# Patient Record
Sex: Female | Born: 1985 | Hispanic: No | Marital: Married | Smoking: Never smoker
Health system: Southern US, Community
[De-identification: ages and names within clinical notes are randomized; demographics above are authoritative.]

## PROBLEM LIST (undated history)

## (undated) DIAGNOSIS — R7303 Prediabetes: Secondary | ICD-10-CM

## (undated) DIAGNOSIS — E538 Deficiency of other specified B group vitamins: Secondary | ICD-10-CM

## (undated) DIAGNOSIS — E079 Disorder of thyroid, unspecified: Secondary | ICD-10-CM

## (undated) DIAGNOSIS — O24419 Gestational diabetes mellitus in pregnancy, unspecified control: Secondary | ICD-10-CM

## (undated) HISTORY — DX: Disorder of thyroid, unspecified: E07.9

## (undated) HISTORY — DX: Deficiency of other specified B group vitamins: E53.8

## (undated) HISTORY — PX: DILATION AND CURETTAGE OF UTERUS: SHX78

## (undated) HISTORY — DX: Prediabetes: R73.03

## (undated) HISTORY — DX: Gestational diabetes mellitus in pregnancy, unspecified control: O24.419

---

## 2013-01-26 ENCOUNTER — Encounter: Payer: Self-pay | Admitting: Adult Health

## 2013-01-26 ENCOUNTER — Ambulatory Visit (INDEPENDENT_AMBULATORY_CARE_PROVIDER_SITE_OTHER): Payer: 59 | Admitting: Adult Health

## 2013-01-26 VITALS — BP 100/62 | HR 65 | Temp 97.9°F | Resp 12 | Wt 148.0 lb

## 2013-01-26 DIAGNOSIS — J209 Acute bronchitis, unspecified: Secondary | ICD-10-CM

## 2013-01-26 MED ORDER — GUAIFENESIN-CODEINE 100-10 MG/5ML PO SYRP: 5.0000 mL | ORAL_SOLUTION | Freq: Three times a day (TID) | ORAL | Status: DC | PRN

## 2013-01-26 MED ORDER — AZITHROMYCIN 250 MG PO TABS: ORAL_TABLET | ORAL | Status: DC

## 2013-01-26 NOTE — Patient Instructions (Addendum)
  Take antibiotic as instructed.  Take Robitussin AC at bedtime for severe cough.  You can take an over-the-counter cough medicine during the day such as Delsym.  Drink plenty of liquids.

## 2013-01-26 NOTE — Assessment & Plan Note (Signed)
Symptoms ongoing x 2 months. Start Azithromycin and Robitussin AC. RTC if symptoms not improved within 3-4 days.

## 2013-01-26 NOTE — Progress Notes (Signed)
  Subjective:    Patient ID: Kristina Mueller, female    DOB: 1986-03-03, 27 y.o.   MRN: [id number]  HPI  Patient is a pleasant 27 y/o female who presents to clinic with cough that started ~ 2 months ago. Recently traveled to [place] and was there from December - March. Her symptoms began in March. She was taking "home remedies" such honey and lemon which helped somewhat; however, symptoms would return. Cough worse at night. She denies sinus congestion or post nasal drip. Cough is productive of white phlegm but has been yellow as well.   Review of Systems  Constitutional: Negative for fever and chills.  HENT: Negative for congestion, sore throat, rhinorrhea, sneezing, postnasal drip and sinus pressure.   Respiratory: Positive for cough and wheezing.   Cardiovascular: Negative for chest pain.    BP 100/62  Pulse 65  Temp(Src) 97.9 F (36.6 C) (Oral)  Resp 12  Wt 148 lb (67.132 kg)  SpO2 98%  LMP 01/20/2013    Objective:   Physical Exam  Constitutional: She is oriented to person, place, and time. She appears well-developed and well-nourished. No distress.  Cardiovascular: Normal rate, regular rhythm, normal heart sounds and intact distal pulses.  Exam reveals no gallop.   No murmur heard. Pulmonary/Chest: Effort normal and breath sounds normal. No respiratory distress. She has no wheezes. She has no rales.  Neurological: She is alert and oriented to person, place, and time.  Skin: Skin is warm and dry.  Psychiatric: She has a normal mood and affect. Her behavior is normal. Judgment and thought content normal.          Assessment & Plan:

## 2013-02-02 ENCOUNTER — Encounter: Payer: Self-pay | Admitting: Adult Health

## 2013-02-02 ENCOUNTER — Ambulatory Visit (INDEPENDENT_AMBULATORY_CARE_PROVIDER_SITE_OTHER): Payer: Managed Care, Other (non HMO) | Admitting: Adult Health

## 2013-02-02 VITALS — BP 100/62 | HR 65 | Temp 97.9°F | Resp 12 | Ht 63.25 in | Wt 147.0 lb

## 2013-02-02 DIAGNOSIS — E039 Hypothyroidism, unspecified: Secondary | ICD-10-CM

## 2013-02-02 DIAGNOSIS — N926 Irregular menstruation, unspecified: Secondary | ICD-10-CM | POA: Insufficient documentation

## 2013-02-02 DIAGNOSIS — Z0000 Encounter for general adult medical examination without abnormal findings: Secondary | ICD-10-CM

## 2013-02-02 LAB — CBC WITH DIFFERENTIAL/PLATELET
Basophils Absolute: 0.1 10*3/uL (ref 0.0–0.1)
Eosinophils Absolute: 0.4 10*3/uL (ref 0.0–0.7)
HCT: 38.3 % (ref 36.0–46.0)
Hemoglobin: 13.1 g/dL (ref 12.0–15.0)
Lymphocytes Relative: 39.1 % (ref 12.0–46.0)
Lymphs Abs: 2.3 10*3/uL (ref 0.7–4.0)
MCHC: 34.1 g/dL (ref 30.0–36.0)
Monocytes Relative: 6.7 % (ref 3.0–12.0)
Neutro Abs: 2.7 10*3/uL (ref 1.4–7.7)
Platelets: 250 10*3/uL (ref 150.0–400.0)
RDW: 13.5 % (ref 11.5–14.6)

## 2013-02-02 MED ORDER — NORETHINDRONE ACET-ETHINYL EST 1-20 MG-MCG PO TABS: ORAL_TABLET | ORAL | Status: DC

## 2013-02-02 MED ORDER — THYROID 30 MG PO TABS: 30.0000 mg | ORAL_TABLET | Freq: Every day | ORAL | Status: DC

## 2013-02-02 NOTE — Assessment & Plan Note (Signed)
Patient is interested in starting back on birth control medication to regulate cycles. She will start 1 active pill x12 weeks consecutively then no pill on week 13. Patient will begin this the Sunday after her next menstrual cycle.

## 2013-02-02 NOTE — Assessment & Plan Note (Signed)
Normal physical exam including breast exam. Labs: CBC, lipids, metabolic panel. Encouraged monthly self breast exam. Discussed importance of exercise consisting of 20-30 minutes of aerobic activity 3-4 times a week. Also discussed diet low in unhealthy fats, rich in fruits, vegetables and complex carbohydrates.

## 2013-02-02 NOTE — Patient Instructions (Addendum)
   Thank you for choosing [facility] for your health care needs.  Please have your labs drawn prior to leaving the office.  The results will be available through MyChart for your convenience. Please remember to activate this. The activation code is located at the end of this form.  I am prescribing Junel 1/20 birth control pills. Start the pills the Sunday after your next menstrual cycle. You will take an active pill for 12 weeks consecutively and then no pill on week 13. This is the week you will get a period. Then start the same cycle the Sunday after your period.

## 2013-02-02 NOTE — Assessment & Plan Note (Signed)
Recent blood work done for thyroid panel. Normal. Continue Armour and refill sent to her pharmacy

## 2013-02-02 NOTE — Progress Notes (Signed)
Subjective:    Patient ID: Kristina Mueller, female    DOB: 1986-01-10, 27 y.o.   MRN: [id number]  HPI  Patient is a pleasant 27 year old female who presents to clinic for her yearly physical. She reports feeling well overall. She has problems with her menstrual cycle. Describes menstrual cycle coming approximately every 3-4 months. She has been on birth control in the past to regulate this. Patient is not currently taking any birth control medication.   Past Medical History  Diagnosis Date  . Thyroid disease     hypothyroid    History   Social History  . Marital Status: Married    Spouse Name: N/A    Number of Children: N/A  . Years of Education: N/A   Occupational History  . Not on file.   Social History Main Topics  . Smoking status: Never Smoker   . Smokeless tobacco: Never Used  . Alcohol Use: No  . Drug Use: No  . Sexually Active: Yes    Birth Control/ Protection: Condom   Other Topics Concern  . Not on file   Social History Narrative  . No narrative on file    Current Outpatient Prescriptions on File Prior to Visit  Medication Sig Dispense Refill  . Multiple Vitamins-Calcium (ONE-A-DAY WOMENS PO) Take by mouth.       No current facility-administered medications on file prior to visit.     Health Maintenance:  Tdap - Uncertain. Need to obtain pt's medication records.  Flu shot - Did not get  PAP - July 2013  Mammography - n/a. Encouraged monthly self breast exam  Colonoscopy - n/a  Labs - cbc, metabolic panel and cholesterol  Depression Screen - no sadness, hopelessness or anhedonia  Tobacco Use - Never  Dental Exams - 12/2012  Vision Exam - Not had done  Exercise - Treadmill 3 days/week for 30 minutes  Diet - occasionally skips meals. Eats lean chicken, goat. No beef, [place] or pork. Does not like fish. Eats fruits and vegetables.   Review of Systems  Constitutional: Negative for fever, chills, appetite change and fatigue.  HENT:  Negative.   Eyes: Negative.   Respiratory: Negative.   Cardiovascular: Negative.   Gastrointestinal: Negative.   Endocrine: Negative.   Genitourinary: Positive for menstrual problem.  Musculoskeletal: Negative.   Allergic/Immunologic: Negative.   Neurological: Negative.   Hematological: Negative.   Psychiatric/Behavioral: Negative.    BP 100/62  Pulse 65  Temp(Src) 97.9 F (36.6 C) (Oral)  Resp 12  Ht 5' 3.25" (1.607 m)  Wt 147 lb (66.679 kg)  BMI 25.82 kg/m2  SpO2 98%  LMP 01/20/2013    Objective:   Physical Exam  Constitutional: She is oriented to person, place, and time. She appears well-developed and well-nourished. No distress.  HENT:  Head: Normocephalic and atraumatic.  Right Ear: External ear normal.  Left Ear: External ear normal.  Nose: Nose normal.  Mouth/Throat: Oropharynx is clear and moist. No oropharyngeal exudate.  Eyes: Conjunctivae and EOM are normal. Pupils are equal, round, and reactive to light. Right eye exhibits no discharge. Left eye exhibits no discharge. No scleral icterus.  Neck: Normal range of motion. Neck supple. No tracheal deviation present. No thyromegaly present.  Cardiovascular: Normal rate, regular rhythm and intact distal pulses.  Exam reveals friction rub. Exam reveals no gallop.   No murmur heard. Pulmonary/Chest: Effort normal and breath sounds normal. No respiratory distress. She has no wheezes. She has no rales. She exhibits no tenderness.  Abdominal:  Soft. Bowel sounds are normal. She exhibits no distension and no mass. There is no tenderness. There is no rebound and no guarding.  Genitourinary: No breast swelling, tenderness, discharge or bleeding.  Musculoskeletal: Normal range of motion. She exhibits no edema and no tenderness.  Lymphadenopathy:    She has no cervical adenopathy.  Neurological: She is alert and oriented to person, place, and time. She has normal reflexes.  Skin: Skin is warm and dry. No rash noted. She is not  diaphoretic. No erythema. No pallor.  Psychiatric: She has a normal mood and affect. Her behavior is normal. Judgment and thought content normal.       Assessment & Plan:

## 2013-02-03 LAB — COMPREHENSIVE METABOLIC PANEL
ALT: 19 U/L (ref 0–35)
AST: 19 U/L (ref 0–37)
Alkaline Phosphatase: 98 U/L (ref 39–117)
Creatinine, Ser: 0.7 mg/dL (ref 0.4–1.2)
Sodium: 139 mEq/L (ref 135–145)
Total Bilirubin: 0.8 mg/dL (ref 0.3–1.2)
Total Protein: 6.9 g/dL (ref 6.0–8.3)

## 2013-02-03 LAB — LIPID PANEL
HDL: 33.6 mg/dL — ABNORMAL LOW (ref >39.00)
LDL Cholesterol: 108 mg/dL — ABNORMAL HIGH (ref 0–99)
Total CHOL/HDL Ratio: 5
VLDL: 18 mg/dL (ref 0.0–40.0)

## 2013-02-03 LAB — LDL CHOLESTEROL, DIRECT: Direct LDL: 107.6 mg/dL

## 2013-02-25 ENCOUNTER — Ambulatory Visit: Payer: Self-pay | Admitting: Adult Health

## 2013-03-09 ENCOUNTER — Encounter: Payer: Self-pay | Admitting: Adult Health

## 2013-03-09 ENCOUNTER — Ambulatory Visit (INDEPENDENT_AMBULATORY_CARE_PROVIDER_SITE_OTHER): Payer: Managed Care, Other (non HMO) | Admitting: Adult Health

## 2013-03-09 VITALS — BP 100/62 | HR 74 | Resp 12 | Wt 147.5 lb

## 2013-03-09 DIAGNOSIS — N926 Irregular menstruation, unspecified: Secondary | ICD-10-CM

## 2013-03-09 DIAGNOSIS — N912 Amenorrhea, unspecified: Secondary | ICD-10-CM

## 2013-03-09 LAB — POCT URINE PREGNANCY: Preg Test, Ur: NEGATIVE

## 2013-03-09 NOTE — Assessment & Plan Note (Signed)
Patient has had a history of irregular menstrual cycles since menarche. Urine pregnancy test in office was negative. Instructed patient to start birth control pills this Sunday. She will take an active pill for 12 weeks consecutively than that no pill on week 13. She will then repeat cycle of beginning birth control on this Sunday beginning week 14. Discussed side effects of birth control. Instructed to use backup birth control method during first pack of birth control pills. RTC if any problems or concerns.

## 2013-03-09 NOTE — Progress Notes (Signed)
  Subjective:    Patient ID: Kristina Mueller, female    DOB: Mar 07, 1986, 27 y.o.   MRN: [id number]  HPI  Pt is a pleasant 27 year old female who presents to clinic with complaints of irregular menstrual cycles. She reports last menstrual cycle May 2 and lasted five days. She has not had a menstrual cycle since and would like to start on BC pills which I prescribed for her the last time I saw her on 02/02/13. She reports irregular cycles since her teenage years where she would have a period ~ every 3 months. Menarche at age 48. She had a baby girl in 2012. She reports no problems getting pregnant. She is currently using condoms to prevent pregnancy. She did a home pregnancy test this morning which was negative.  Current Outpatient Prescriptions on File Prior to Visit  Medication Sig Dispense Refill  . Multiple Vitamins-Calcium (ONE-A-DAY WOMENS PO) Take by mouth.      . thyroid (ARMOUR) 30 MG tablet Take 1 tablet (30 mg total) by mouth daily.  30 tablet  12  . norethindrone-ethinyl estradiol (MICROGESTIN,JUNEL,LOESTRIN) 1-20 MG-MCG tablet Take 1 active pill daily for 12 weeks consecutively then take no pill on week 13. Repeat cycle.  4 Package  5   No current facility-administered medications on file prior to visit.     Review of Systems  Endocrine: Negative.   Genitourinary: Positive for menstrual problem. Negative for vaginal bleeding, vaginal discharge, vaginal pain and pelvic pain.  Psychiatric/Behavioral: Negative.        Objective:   Physical Exam  Constitutional: She is oriented to person, place, and time. She appears well-developed and well-nourished. No distress.  Cardiovascular: Normal rate and regular rhythm.   Pulmonary/Chest: Effort normal. No respiratory distress.  Neurological: She is alert and oriented to person, place, and time.  Psychiatric: She has a normal mood and affect. Her behavior is normal. Judgment and thought content normal.          Assessment & Plan:

## 2013-03-09 NOTE — Patient Instructions (Addendum)
  Please provide [place] with a urine sample for a pregnancy test.  Start your birth control pills on Sunday whether you have started your period or not.  Continue to use a back up method of birth control for your first pack.  You will take one active pill daily for 12 consecutive weeks, then none on week 13. This is the week you will have a period. Then begin the entire cycle again on the following Sunday.

## 2013-03-11 ENCOUNTER — Telehealth: Payer: Self-pay | Admitting: Adult Health

## 2013-03-11 NOTE — Telephone Encounter (Signed)
Pt called she would like to start birth controls  quasense [facility] [place]

## 2013-03-11 NOTE — Telephone Encounter (Signed)
Please find out why the patient specifically wants to use this medication. I prescribed what I thought was a good option.

## 2013-03-14 ENCOUNTER — Other Ambulatory Visit: Payer: Self-pay | Admitting: Adult Health

## 2013-03-14 MED ORDER — LEVONORGEST-ETH ESTRAD 91-DAY 0.15-0.03 MG PO TABS: 1.0000 | ORAL_TABLET | Freq: Every day | ORAL | Status: DC

## 2013-03-14 NOTE — Telephone Encounter (Signed)
When we were discussing the birth control options, she should have told me she wanted to take that one. I have sent in to the pharmacy. Please let her know it will be taken exactly as we discussed (12 weeks of active pill then week # 13 no pill).

## 2013-03-14 NOTE — Telephone Encounter (Signed)
Pt states she has used this birth control before with no side effects or problems and wanted to use it again this time.

## 2013-03-14 NOTE — Telephone Encounter (Signed)
Left message for pt, advising that Rx was sent to pharmacy.

## 2013-03-17 ENCOUNTER — Encounter: Payer: Self-pay | Admitting: Adult Health

## 2013-04-01 ENCOUNTER — Telehealth: Payer: Self-pay | Admitting: Adult Health

## 2013-04-01 NOTE — Telephone Encounter (Signed)
Pt requesting one touch machine and strips so that she can occasionally check her sugars. History of gestational diabetes. Ok?

## 2013-04-01 NOTE — Telephone Encounter (Signed)
One touch Ultra mini test strips and machine called into [facility]

## 2013-04-01 NOTE — Telephone Encounter (Signed)
Blood glucose normal. This is something we monitor yearly for her. She does not need to check finger sticks.

## 2013-04-04 NOTE — Telephone Encounter (Signed)
Pt.notified

## 2013-04-26 ENCOUNTER — Encounter: Payer: Self-pay | Admitting: Adult Health

## 2013-04-26 ENCOUNTER — Encounter: Payer: Self-pay | Admitting: *Deleted

## 2013-04-26 ENCOUNTER — Ambulatory Visit (INDEPENDENT_AMBULATORY_CARE_PROVIDER_SITE_OTHER): Payer: Managed Care, Other (non HMO) | Admitting: Adult Health

## 2013-04-26 VITALS — BP 100/60 | HR 84 | Temp 97.8°F | Resp 12 | Wt 147.5 lb

## 2013-04-26 DIAGNOSIS — L80 Vitiligo: Secondary | ICD-10-CM | POA: Insufficient documentation

## 2013-04-26 DIAGNOSIS — Z713 Dietary counseling and surveillance: Secondary | ICD-10-CM

## 2013-04-26 DIAGNOSIS — R7301 Impaired fasting glucose: Secondary | ICD-10-CM | POA: Insufficient documentation

## 2013-04-26 LAB — HEMOGLOBIN A1C: Hgb A1c MFr Bld: 5.7 % (ref 4.6–6.5)

## 2013-04-26 NOTE — Assessment & Plan Note (Signed)
Patient to keep a food diary x3 days. She needs to document portion sizes. I have advised her to document everything that she eats and drinks. She will return to clinic in one to 2 weeks for evaluation. Also, encouraged her to do cardio for 30 minutes and incorporate at least 15 minutes of light weight training and abdominal exercises.

## 2013-04-26 NOTE — Assessment & Plan Note (Signed)
Patient is extremely concerned about becoming a diabetic. She has a history of gestational diabetes and I can certainly appreciate her concern for type 2 diabetes. She also has a strong family history of diabetes. Patient is reporting that her fasting blood sugars are in an abnormal range. Her diet is high in starches, carbohydrates with small amount of protein. I will check a hemoglobin A1c and hopefully this will allay some of her anxiety.

## 2013-04-26 NOTE — Assessment & Plan Note (Signed)
Very small area of discoloration. Uncertain if this is the vitiligo. However, given family history of same, I will refer her to dermatology. Refer to [facility] for evaluation.

## 2013-04-26 NOTE — Progress Notes (Signed)
  Subjective:    Patient ID: Kristina Mueller, female    DOB: [birth year], 26 y.o.   MRN: [id number]  HPI  Patient is a pleasant 27 year old female who presents to clinic with a small white patch underneath the left side of her lip. She reports her father has a history of vitiligo and she is concerned this may be the same thing.  Also, patient is concerned that she may be a diabetic. She has been checking her blood sugar at home with a glucose monitor that was given to her during pregnancy. She developed gestational diabetes. She reports that fasting blood glucose in the morning are running around 105. Her mother is a diabetic and has many problems as a result of her diabetes. Patient is extremely concerned and does not want the same for her.  Patient also reports that she is not able to lose weight. Her diet consists of rice, vegetables and chicken approximately twice a week. She does cardio exercise for 45 minutes x5 days a week. She has been consistently at 147 pounds. She just does not understand why she cannot lose weight. She is thinking that this may be a sign of diabetes.   Current Outpatient Prescriptions on File Prior to Visit  Medication Sig Dispense Refill  . levonorgestrel-ethinyl estradiol (SEASONALE,INTROVALE,JOLESSA) 0.15-0.03 MG tablet Take 1 tablet by mouth daily.  1 Package  4  . Multiple Vitamins-Calcium (ONE-A-DAY WOMENS PO) Take by mouth.      . thyroid (ARMOUR) 30 MG tablet Take 1 tablet (30 mg total) by mouth daily.  30 tablet  12   No current facility-administered medications on file prior to visit.   Review of Systems  Constitutional: Negative.   HENT: Negative.   Respiratory: Negative.   Cardiovascular: Negative.   Gastrointestinal: Negative.   Endocrine: Negative.   Genitourinary: Negative.   Neurological: Negative.   Psychiatric/Behavioral: Negative.   All other systems reviewed and are negative.       Objective:   Physical Exam  Constitutional: She is  oriented to person, place, and time. She appears well-developed and well-nourished. No distress.  HENT:  Head: Normocephalic and atraumatic.  Eyes: Conjunctivae and EOM are normal. Pupils are equal, round, and reactive to light.  Neck: Normal range of motion. Neck supple.  Cardiovascular: Normal rate, regular rhythm, normal heart sounds and intact distal pulses.  Exam reveals no gallop and no friction rub.   No murmur heard. Pulmonary/Chest: Effort normal and breath sounds normal. No respiratory distress. She has no wheezes. She has no rales. She exhibits no tenderness.  Abdominal: Soft. Bowel sounds are normal. She exhibits no distension and no mass. There is no tenderness. There is no rebound and no guarding.  Musculoskeletal: Normal range of motion. She exhibits no edema and no tenderness.  Lymphadenopathy:    She has no cervical adenopathy.  Neurological: She is alert and oriented to person, place, and time. She has normal reflexes.  Skin: Skin is warm and dry.  Small white discoloration of skin on the lateral left side of lip.  Psychiatric: She has a normal mood and affect. Her behavior is normal. Judgment and thought content normal.          Assessment & Plan:

## 2013-04-26 NOTE — Patient Instructions (Addendum)
  Keep a food diary for 3 days. Write down everything that you eat and drink in that time period.  Please include the portion size of what you are eating.  Please have your lab work done prior to leaving the office.  We will contact you with the results once they're available.  I am referring you to [facility] for evaluation of the discoloration on your face.

## 2013-04-27 ENCOUNTER — Encounter: Payer: Self-pay | Admitting: Emergency Medicine

## 2013-05-31 ENCOUNTER — Telehealth: Payer: Self-pay | Admitting: Adult Health

## 2013-05-31 NOTE — Telephone Encounter (Signed)
Pt has question about her Hemoglobin/ A1C Test results. Please contact her at [phone].Pt would like to speak with Raquel

## 2013-05-31 NOTE — Telephone Encounter (Signed)
Appointment scheduled.

## 2013-05-31 NOTE — Telephone Encounter (Signed)
Have patient make appt

## 2013-05-31 NOTE — Telephone Encounter (Signed)
Pt has researched A1C and is concerned about 5.7 being prediabetic and is concerned because she exercises and watches her diet and wants to discuss Metformin. Advised what Raquel had stated with last labwork but pt wanted more information.

## 2013-06-06 ENCOUNTER — Ambulatory Visit (INDEPENDENT_AMBULATORY_CARE_PROVIDER_SITE_OTHER): Payer: Managed Care, Other (non HMO) | Admitting: Adult Health

## 2013-06-06 ENCOUNTER — Encounter: Payer: Self-pay | Admitting: Adult Health

## 2013-06-06 VITALS — BP 102/60 | HR 64 | Resp 12 | Ht 63.25 in | Wt 148.0 lb

## 2013-06-06 DIAGNOSIS — Z3009 Encounter for other general counseling and advice on contraception: Secondary | ICD-10-CM

## 2013-06-06 DIAGNOSIS — R7309 Other abnormal glucose: Secondary | ICD-10-CM

## 2013-06-06 DIAGNOSIS — R7303 Prediabetes: Secondary | ICD-10-CM

## 2013-06-06 DIAGNOSIS — Z30011 Encounter for initial prescription of contraceptive pills: Secondary | ICD-10-CM | POA: Insufficient documentation

## 2013-06-06 MED ORDER — LEVONORGEST-ETH ESTRAD 91-DAY 0.15-0.03 MG PO TABS: 1.0000 | ORAL_TABLET | Freq: Every day | ORAL | Status: DC

## 2013-06-06 MED ORDER — METFORMIN HCL 500 MG PO TABS: ORAL_TABLET | ORAL | Status: DC

## 2013-06-06 NOTE — Assessment & Plan Note (Signed)
Hemoglobin A1c 5.7 - prediabetes. Risk factors including family history and she was also a gestational diabetic. Start metformin 500 mg with her evening meal. We will try this for one week and if tolerates this we will add 500 mg to breakfast as well. Return in 3 months for repeat A1c

## 2013-06-06 NOTE — Assessment & Plan Note (Signed)
Start quasense 1 active pill daily x12 weeks. No pills on week #13.

## 2013-06-06 NOTE — Patient Instructions (Addendum)
  Please start metformin 500 mg with your evening meal for 1 week. If you are tolerating well then we will add to your breakfast meal.  I have sent in a prescription for [employer].

## 2013-06-06 NOTE — Progress Notes (Signed)
  Subjective:    Patient ID: Kristina Mueller, female    DOB: 1985-10-04, 27 y.o.   MRN: [id number]  HPI  Patient is a pleasant 27 year old female who presents to clinic with concerns of her hemoglobin A1c which was 5.7, prediabetic. She has a family history of diabetes as well as she had gestational diabetes. She exercises regularly approximately 45 minutes daily. She watches her diet closely as well. She is very concerned and would like to start medication to prevent diabetes.  She is also requesting birth control medication. We have discussed this in the past and I had sent in prescription to her pharmacy however she did not fill the medication.   Current Outpatient Prescriptions on File Prior to Visit  Medication Sig Dispense Refill  . thyroid (ARMOUR) 30 MG tablet Take 1 tablet (30 mg total) by mouth daily.  30 tablet  12   No current facility-administered medications on file prior to visit.     Review of Systems  Constitutional: Negative.   Respiratory: Negative.   Cardiovascular: Negative.   Gastrointestinal: Negative.   Endocrine: Negative.   Genitourinary: Negative.   Neurological: Negative.   Psychiatric/Behavioral: Negative.     BP 102/60  Pulse 64  Resp 12  Ht 5' 3.25" (1.607 m)  Wt 148 lb (67.132 kg)  BMI 26 kg/m2  SpO2 99%     Objective:   Physical Exam  Constitutional: She is oriented to person, place, and time. She appears well-developed and well-nourished. No distress.  Cardiovascular: Normal rate and regular rhythm.   Pulmonary/Chest: Effort normal. No respiratory distress.  Musculoskeletal: Normal range of motion.  Neurological: She is alert and oriented to person, place, and time.  Skin: Skin is warm.  Psychiatric: She has a normal mood and affect. Her behavior is normal. Thought content normal.          Assessment & Plan:

## 2013-06-13 ENCOUNTER — Telehealth: Payer: Self-pay | Admitting: *Deleted

## 2013-06-13 NOTE — Telephone Encounter (Signed)
Have her take 1000 mg with breakfast and 500 mg at dinner.

## 2013-06-13 NOTE — Telephone Encounter (Signed)
Pt left voicemail, has already increased her Metformin to 1000 mg daily, states she is not losing any weight even with exercise for one hour 5 days weekly and watching her carb intake, (has been on Metformin for only one week). Asking if she can increase Metformin to 1500 mg daily?

## 2013-06-13 NOTE — Telephone Encounter (Signed)
Pt notified. Would like a referral to dietician. Do you want her Rx for Metformin to be 500 mg tid, or divided bid? I can send Rx.

## 2013-06-13 NOTE — Telephone Encounter (Signed)
Metformin is not used to decrease weight. In some people it has weight loss as a side effect. She may increase the dose but I put her on metformin for prediabetes. I can refer her to a dietician for her weight.

## 2013-06-14 MED ORDER — METFORMIN HCL 500 MG PO TABS: ORAL_TABLET | ORAL | Status: DC

## 2013-06-30 ENCOUNTER — Ambulatory Visit: Payer: Managed Care, Other (non HMO) | Admitting: Adult Health

## 2013-07-20 ENCOUNTER — Telehealth: Payer: Self-pay | Admitting: Adult Health

## 2013-07-20 NOTE — Telephone Encounter (Signed)
The patient is taking her metformin 3 times a day as instructed , but she is running out of her medicine and she is needing a refill. She only has two day left.

## 2013-07-20 NOTE — Telephone Encounter (Signed)
Advised pt that refills are available at her pharmacy.

## 2013-08-10 ENCOUNTER — Ambulatory Visit: Payer: Managed Care, Other (non HMO) | Admitting: Adult Health

## 2013-08-12 ENCOUNTER — Telehealth: Payer: Self-pay | Admitting: Adult Health

## 2013-08-12 NOTE — Telephone Encounter (Signed)
Pt states she needs examination regarding green card and it is an emergency that she have this done today if possible.  Pt refused appt on Monday, states it has to be done today.  Please advise.

## 2013-08-12 NOTE — Telephone Encounter (Signed)
Pt states there is a form she and her husband need to have completed for her green card application. She was asking if Raquel is able to complete it for her. She says some doctors will not complete and she wanted to know before she brought for by office.

## 2013-08-12 NOTE — Telephone Encounter (Signed)
I need more information - what form specifically?

## 2013-08-15 NOTE — Telephone Encounter (Signed)
Do you mind calling her? Thank you

## 2013-08-15 NOTE — Telephone Encounter (Signed)
Left message to return call 

## 2013-08-22 NOTE — Telephone Encounter (Signed)
Left message to return to call

## 2013-11-14 LAB — CBC WITH DIFFERENTIAL/PLATELET
BASOS ABS: 0.1 10*3/uL (ref 0.0–0.1)
Basophil %: 0.9 %
Eosinophil #: 0.6 10*3/uL (ref 0.0–0.7)
Eosinophil %: 5.3 %
HCT: 36.6 % (ref 35.0–47.0)
HGB: 11.6 g/dL — ABNORMAL LOW (ref 12.0–16.0)
Lymphocyte #: 5.9 10*3/uL — ABNORMAL HIGH (ref 1.0–3.6)
Lymphocyte %: 50 %
MCH: 27.8 pg (ref 26.0–34.0)
MCHC: 31.7 g/dL — AB (ref 32.0–36.0)
MCV: 88 fL (ref 80–100)
MONO ABS: 1.1 x10 3/mm — AB (ref 0.2–0.9)
Monocyte %: 9 %
Neutrophil #: 4.1 10*3/uL (ref 1.4–6.5)
Neutrophil %: 34.8 %
Platelet: 250 10*3/uL (ref 150–440)
RBC: 4.17 10*6/uL (ref 3.80–5.20)
RDW: 13.1 % (ref 11.5–14.5)
WBC: 11.9 10*3/uL — ABNORMAL HIGH (ref 3.6–11.0)

## 2013-11-14 LAB — URINALYSIS, COMPLETE
BILIRUBIN, UR: NEGATIVE
BLOOD: NEGATIVE
GLUCOSE, UR: NEGATIVE mg/dL (ref 0–75)
Ketone: NEGATIVE
Leukocyte Esterase: NEGATIVE
NITRITE: NEGATIVE
Ph: 7 (ref 4.5–8.0)
Protein: 100
Specific Gravity: 1.018 (ref 1.003–1.030)

## 2013-11-14 LAB — COMPREHENSIVE METABOLIC PANEL
ALK PHOS: 85 U/L
ANION GAP: 8 (ref 7–16)
Albumin: 3.7 g/dL (ref 3.4–5.0)
BILIRUBIN TOTAL: 0.3 mg/dL (ref 0.2–1.0)
BUN: 7 mg/dL (ref 7–18)
CALCIUM: 8.8 mg/dL (ref 8.5–10.1)
Chloride: 108 mmol/L — ABNORMAL HIGH (ref 98–107)
Co2: 21 mmol/L (ref 21–32)
Creatinine: 0.75 mg/dL (ref 0.60–1.30)
Glucose: 83 mg/dL (ref 65–99)
Osmolality: 271 (ref 275–301)
POTASSIUM: 3.9 mmol/L (ref 3.5–5.1)
SGOT(AST): 21 U/L (ref 15–37)
SGPT (ALT): 15 U/L (ref 12–78)
Sodium: 137 mmol/L (ref 136–145)
Total Protein: 7.3 g/dL (ref 6.4–8.2)

## 2013-11-14 LAB — PROTIME-INR
INR: 1.1
PROTHROMBIN TIME: 14.2 s (ref 11.5–14.7)

## 2013-11-14 LAB — HCG, QUANTITATIVE, PREGNANCY: BETA HCG, QUANT.: 6529 m[IU]/mL — AB

## 2013-11-14 LAB — MAGNESIUM: MAGNESIUM: 1.6 mg/dL — AB

## 2013-11-14 LAB — APTT: ACTIVATED PTT: 30.7 s (ref 23.6–35.9)

## 2013-11-14 LAB — LIPASE, BLOOD: Lipase: 217 U/L (ref 73–393)

## 2013-11-15 ENCOUNTER — Observation Stay: Payer: Self-pay | Admitting: Obstetrics and Gynecology

## 2013-11-15 LAB — CBC WITH DIFFERENTIAL/PLATELET
Basophil #: 0.1 10*3/uL (ref 0.0–0.1)
Basophil %: 0.9 %
Eosinophil #: 0.4 10*3/uL (ref 0.0–0.7)
Eosinophil %: 4.7 %
HCT: 29.1 % — AB (ref 35.0–47.0)
HGB: 10 g/dL — AB (ref 12.0–16.0)
LYMPHS PCT: 42.1 %
Lymphocyte #: 3.6 10*3/uL (ref 1.0–3.6)
MCH: 30.1 pg (ref 26.0–34.0)
MCHC: 34.4 g/dL (ref 32.0–36.0)
MCV: 88 fL (ref 80–100)
Monocyte #: 0.8 x10 3/mm (ref 0.2–0.9)
Monocyte %: 9.2 %
NEUTROS PCT: 43.1 %
Neutrophil #: 3.7 10*3/uL (ref 1.4–6.5)
PLATELETS: 167 10*3/uL (ref 150–440)
RBC: 3.33 10*6/uL — AB (ref 3.80–5.20)
RDW: 13.5 % (ref 11.5–14.5)
WBC: 8.5 10*3/uL (ref 3.6–11.0)

## 2013-11-16 LAB — PATHOLOGY REPORT

## 2013-12-12 ENCOUNTER — Other Ambulatory Visit: Payer: Self-pay | Admitting: Adult Health

## 2013-12-25 ENCOUNTER — Other Ambulatory Visit: Payer: Self-pay | Admitting: Adult Health

## 2014-01-22 ENCOUNTER — Other Ambulatory Visit: Payer: Self-pay | Admitting: Adult Health

## 2014-02-26 ENCOUNTER — Other Ambulatory Visit: Payer: Self-pay | Admitting: Adult Health

## 2014-02-27 NOTE — Telephone Encounter (Signed)
Electronic Refill request received. Patient last O.V. was 06/06/13. Patient was told O.V. Needed to be scheduled to receive future refills. No appointment has been made at this time. Please advise.

## 2014-11-13 IMAGING — US US OB < 14 WEEKS - US OB TV
1 series · 13 of 28 positions shown · non-contrast
Comparison: None.

CLINICAL DATA: Vaginal bleeding.  Quantitative beta HCG [DATE].

EXAM:
OBSTETRIC <14 WK US AND TRANSVAGINAL OB US
TECHNIQUE: Both transabdominal and transvaginal ultrasound examinations were
performed for complete evaluation of the gestation as well as the
maternal uterus, adnexal regions, and pelvic cul-de-sac.
Transvaginal technique was performed to assess early pregnancy.

[Series 1: us ob < 14 weeks - us ob tv · 0.28mm/px · 13 of 76 slices shown]
[im 3/76]
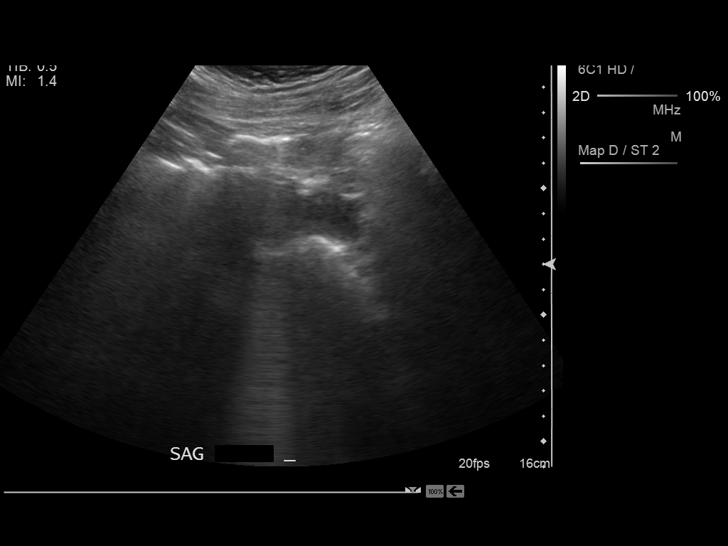
[im 9/76]
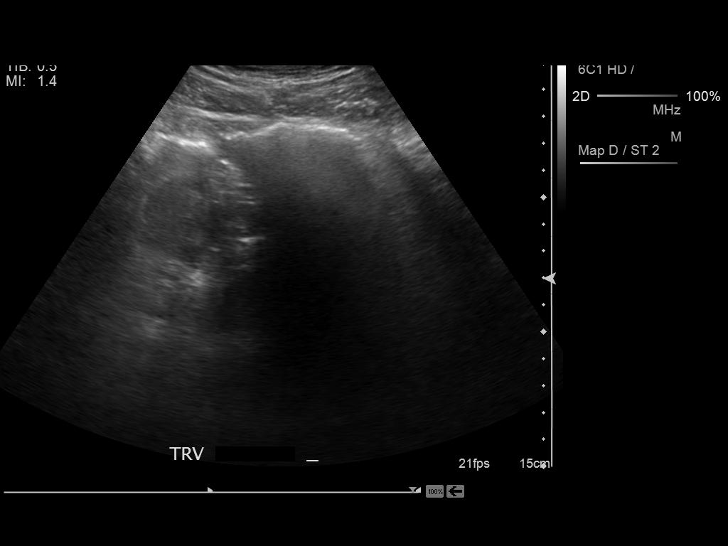
[im 14/76]
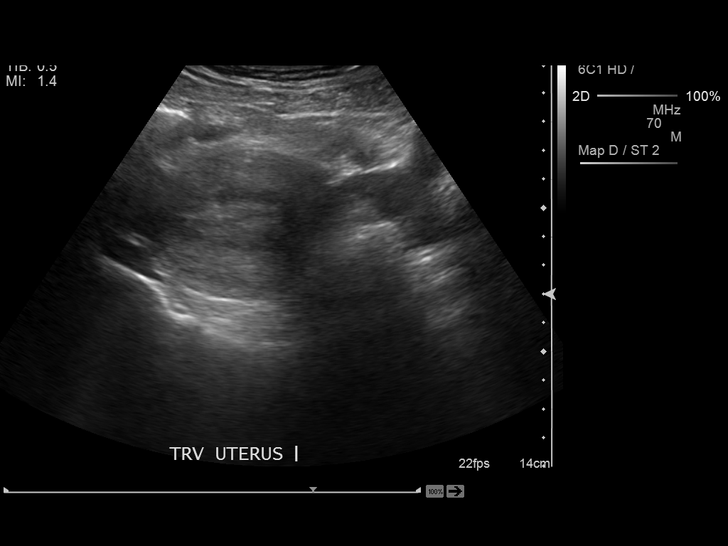
[im 20/76]
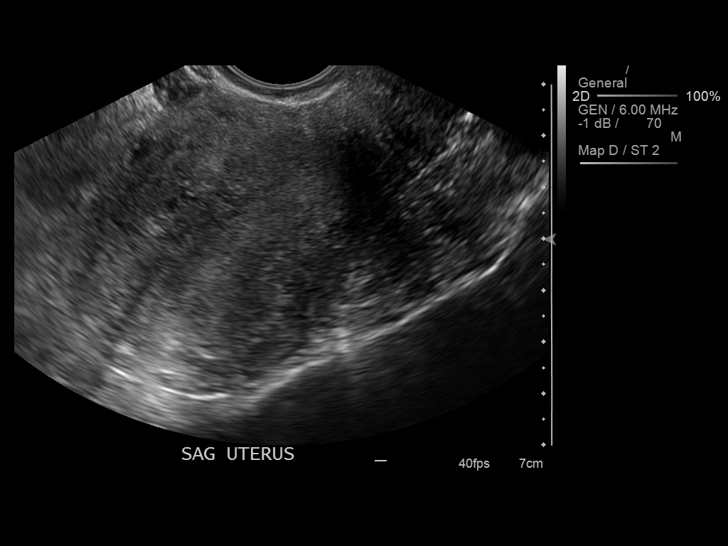
[im 26/76]
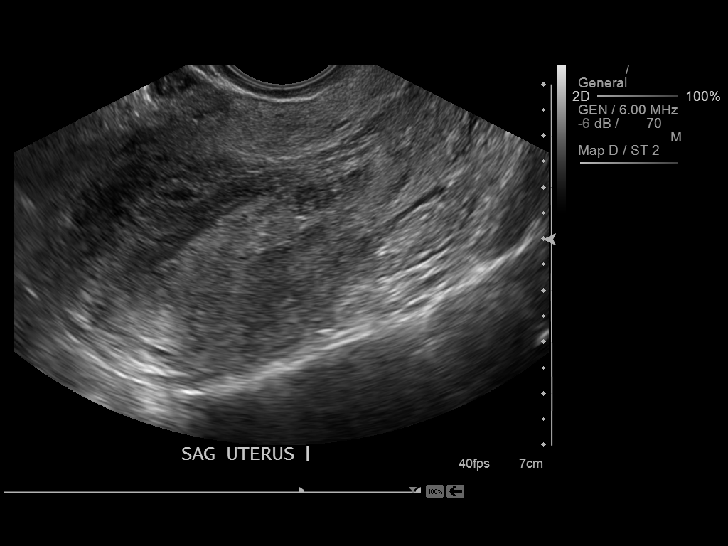
[im 31/76]
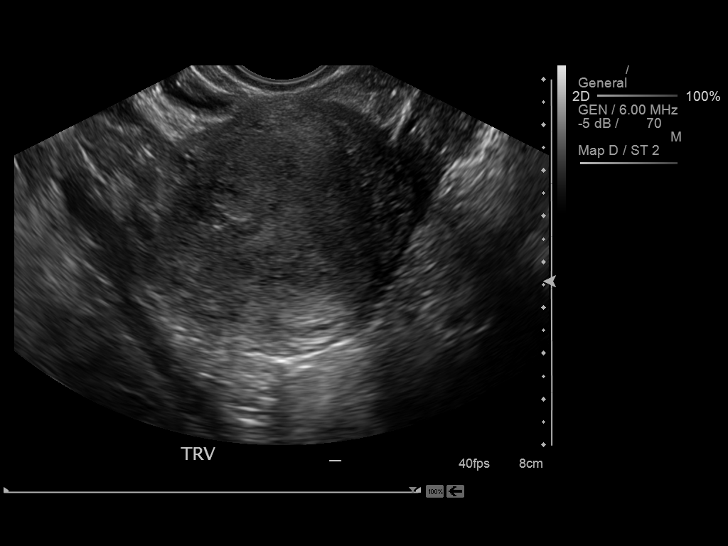
[im 39/76]
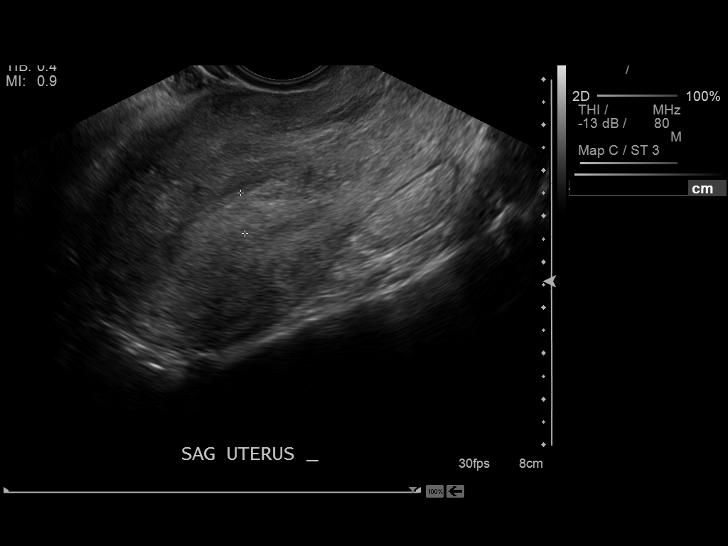
[im 45/76]
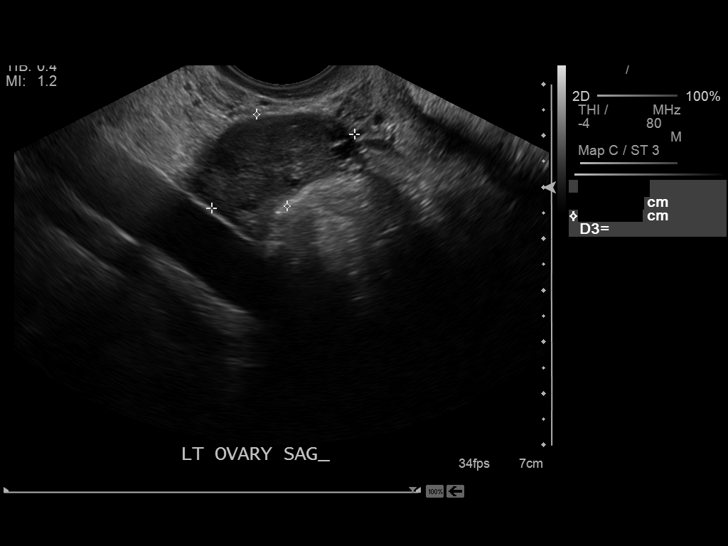
[im 51/76]
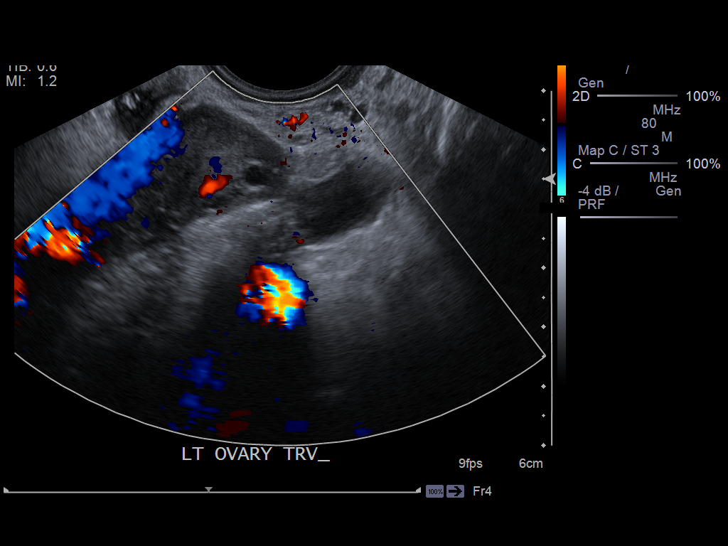
[im 56/76]
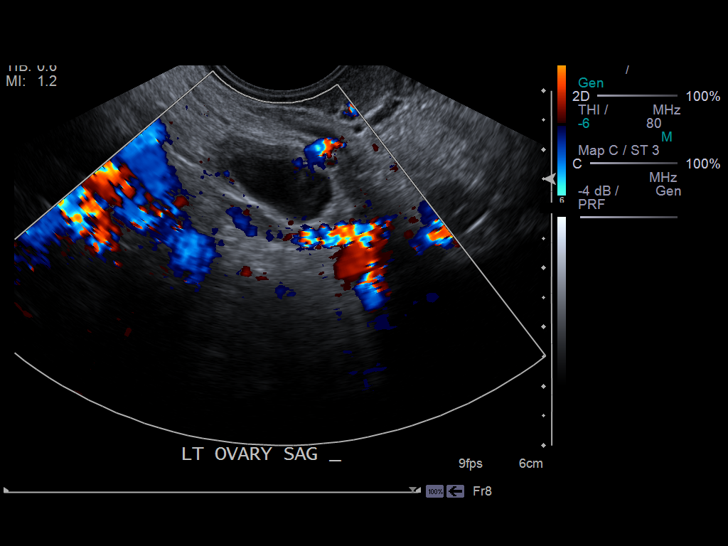
[im 62/76]
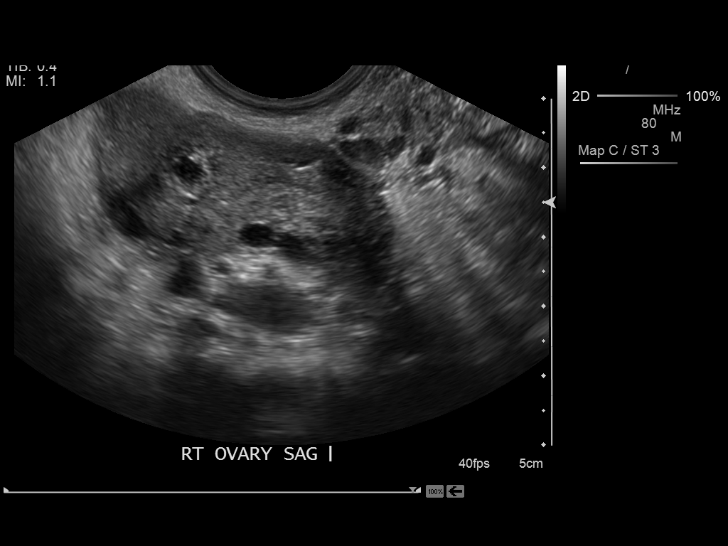
[im 67/76]
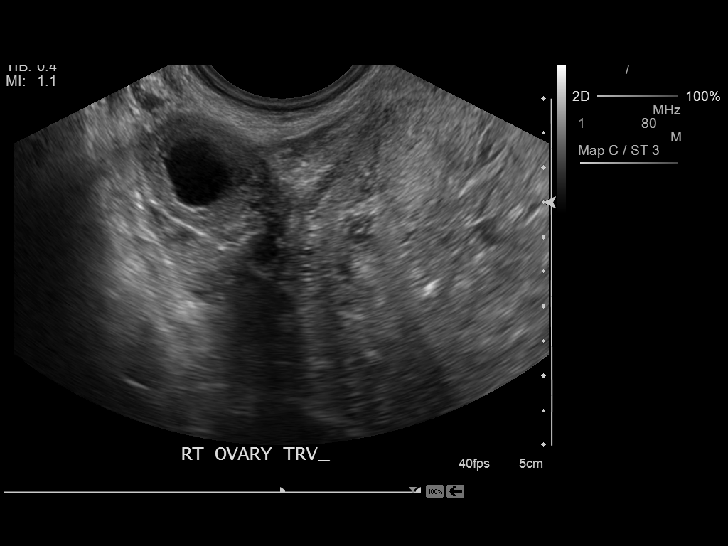
[im 73/76]
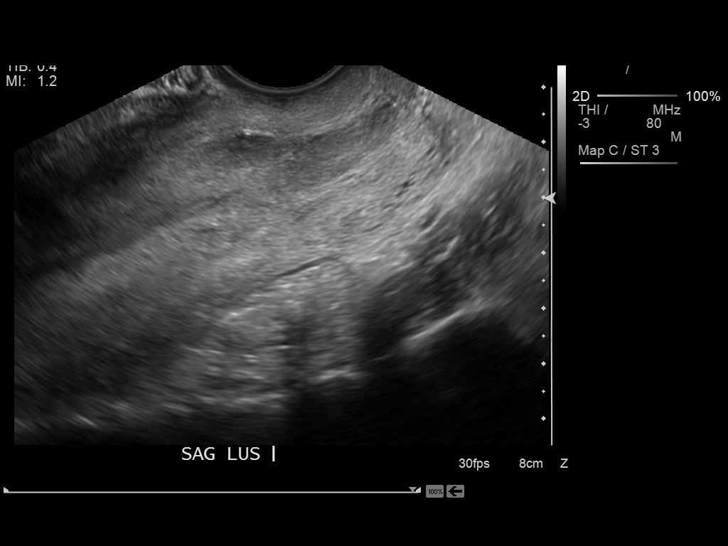

[13 of 28 positions shown; findings below may reference images not displayed]

FINDINGS: Intrauterine gestational sac: None

Yolk sac:  None

Embryo:  None

Maternal uterus/adnexae: Uterus 10 cm in length. Endometrial stripe
8 mm, but there is a 1.9 by 2.7 cm complex structure in the lower
uterine segment canal probably right reflecting hematoma.

Right ovary measures 4.6 x 1.9 by 1.9 cm and contains several
follicles as well as a 1.0 cm mildly complex cystic structure with
thick wall. Left ovary measures 3.1 x 1.9 x 1.6 cm and contains a
1.9 x 0.9 by 1.8 cm minimally complex cystic structure. No free
pelvic fluid identified.
IMPRESSION: 1. Suspected blood products in the lower uterine segment. No
visualized viable pregnancy at this time. The bleeding and blood
products favor this as representing spontaneous abortion in progress
rather than early pregnancy.
2. There mildly complex cystic lesions associated with both ovaries.
Delete the lesion in the right ovary is probably a corpus luteum
cyst. The left ovarian lesion is nonspecific its but probably a
small complex cyst. Given the lack of free pelvic fluid and the
presence of suspected blood products in the lower uterine segment, I
represent ectopic pregnancy, but I do recommend followup of the
quantitative beta HCG trend and a low threshold for reimaging.

## 2015-01-13 NOTE — H&P (Signed)
PATIENT NAME:  Kristina Mueller, Kristina Mueller MR#:  [id number] DATE OF BIRTH:  1986/05/18  DATE OF ADMISSION:  11/14/2013  ADMISSION DIAGNOSIS:  1.  Incomplete abortion.   HISTORY OF PRESENT ILLNESS:  The patient is a 29 year old married female, gravida 2, para 1-0-0-1, unsure of last menstrual period, with incomplete abortion who presented through the ER with heavy vaginal bleeding and cramping. Quantitative hCG titer was 6500. Ultrasound demonstrated no significant tissue within the uterine cavity, although there was some heterogenous tissue in the lower uterine segment. There was no evidence of ectopic pregnancy. There was a right ovarian cyst present. On clinical exam, the patient was noted to have an open cervix with ring forceps removing tissue consistent with products of conception. Likely there was incomplete removal of tissue. The patient is now to proceed with suction D and C for complete treatment of this condition.   PAST MEDICAL HISTORY: 1.  Hypothyroidism.  2.  PCOD.   PAST SURGICAL HISTORY: None.   PAST OBSTETRICAL HISTORY: Para 1-0-0-1, SVD x 1.   FAMILY HISTORY: Noncontributory.   SOCIAL HISTORY: The patient does not smoke, does not drink, does not use drugs.   CURRENT MEDICATIONS: Metformin 1500 mg a day. Synthroid, dosage unknown.   PHYSICAL EXAMINATION:  VITAL SIGNS:  Heart rate 87, blood pressure 110/70.  GENERAL:  The patient is a pleasant, alert, oriented female in no acute distress. Affect is appropriate.  HEENT:  Oropharynx clear.  LUNGS: Clear.  HEART: Regular rate and rhythm without murmur per ER evaluation.  ABDOMEN: Soft and nontender without acute peritoneal findings.  PELVIC: External genitalia: Blood-tinged speculum exam reveals an open cervix with extensive clot in the os. At the os this was all removed with a ring forceps. The ring forceps also removed tissue consistent with POC. Bimanual exam reveals a midplane, top normal-sized uterus without adnexal tenderness.   EXTREMITIES: Without clubbing, cyanosis or edema.   IMPRESSION:  Incomplete abortion.   PLAN:  Suction D and C.   CONSENT NOTE: The patient has been counseled regarding surgical procedure. She is understanding of the planned procedure and is aware and accepting of all surgical risks which include, but are not limited to bleeding, infection, pelvic organ injury with need for repair, uterine perforation, anesthesia risks, etc. All questions have been answered. Informed consent is given. The patient is ready and willing to proceed with the surgery as scheduled.     ____________________________ Prentice DockerMartin A. Jazariah Teall, MD mad:dmm D: 11/14/2013 21:00:38 ET T: 11/14/2013 21:18:21 ET JOB#: [id number]  cc: Daphine DeutscherMartin A. Vinod Mikesell, MD, <Dictator> Prentice DockerMARTIN A Lamoyne Palencia MD ELECTRONICALLY SIGNED 11/17/2013 10:11

## 2015-01-13 NOTE — Op Note (Signed)
PATIENT NAME:  Kristina Mueller, Kristina Mueller MR#:  [id number] DATE OF BIRTH:  27-Dec-1985  DATE OF PROCEDURE:  11/14/2013  PREOPERATIVE DIAGNOSES:  1.  Incomplete abortion.  2.  O+ blood type.   POSTOPERATIVE DIAGNOSES:  1.  Incomplete abortion.  2.  O+ blood type.   OPERATIVE PROCEDURE: Suction dilatation and curettage.   SURGEON: Herold HarmsMartin A Terelle Dobler, M.D.   FIRST ASSISTANT: Newman NipAllen Benda, PA-S.   ANESTHESIA: General.   INDICATIONS: The patient is a 29 year old female gravida 2, para 1- 0-0-1 with an incomplete abortion, who presents for surgical management.   FINDINGS AT SURGERY: Notable for an open cervical os. There was a small amount of POC that was retrieved from the uterine cavity.   DESCRIPTION OF PROCEDURE: The patient was brought to the operating room where she was placed in the supine position. General anesthesia was induced without difficulty. She was placed in the dorsal lithotomy position using the candycane stirrups. A Betadine perineal, intravaginal prep and drape was performed in standard fashion. A red Robinson catheter was used to drain 700 mL of urine from the bladder. A weighted speculum was placed into the vagina, and a single-tooth tenaculum was placed on the anterior lip of the cervix. The os was open to ring forceps. A #10-French catheter was used with suction curettage to remove residual tissue from the intrauterine cavity. A small of tissue was removed. A serrated curette was then also used to verify that no significant residual tissue was left behind. A good cry was obtained. Upon completion of the procedure, all instrumentation was removed from the vagina. The patient was then awakened, mobilized, and taken to the recovery room in satisfactory condition.   ESTIMATED BLOOD LOSS: 25 mL.   IV FLUIDS: 300 mL.  URINE OUTPUT: 700 mL.  COUNTS: All instruments, needle, and sponge counts were verified as correct.  ____________________________ Prentice DockerMartin A. Edgardo Petrenko,  MD mad:aw D: 11/14/2013 22:32:56 ET T: 11/15/2013 07:02:20 ET JOB#: [id number]  cc: Daphine DeutscherMartin A. Suha Schoenbeck, MD, <Dictator> [facility] Prentice DockerMARTIN A Lesa Vandall MD ELECTRONICALLY SIGNED 11/17/2013 10:11

## 2015-01-30 NOTE — H&P (Signed)
L&D Evaluation:  History:  HPI 29 yo G2P1001 admitted for Suction D&C due to Incomplete Abortion.  SEE DICTATED H&P FOR DETAILS.   Presents with vaginal bleeding   Patient's Medical History Thyroid Disease  PCO   Patient's Surgical History none   Medications METFORMIN, SYNTHROID   Allergies NKDA   Social History none   Family History Non-Contributory   ROS:  General normal   HEENT normal   CNS normal   GI normal   GU VAGINAL BLEEDING   Resp normal   CV normal   Renal normal   MS normal   Exam:  Vital Signs stable   General no apparent distress   Mental Status clear   Chest clear   Heart no murmur/gallop/rubs   Abdomen NO PERITONEAL SIGNS   Pelvic no external lesions, OPEN-POC REMOVED WITH RING FORCEPS   Other O+ HCG 6500   Impression:  Impression INCOMPLETE AB   Plan:  Plan SUCTION D&C   Comments FULL CONSENT OBTAINED - SEE DICTATED H&P   Follow Up Appointment in 1 week   Electronic Signatures: Ardythe Klute, Prentice DockerMartin A (MD)  (Signed 23-Feb-15 21:23)  Authored: L&D Evaluation   Last Updated: 23-Feb-15 21:23 by Aries Kasa, Prentice DockerMartin A (MD)

## 2015-10-11 ENCOUNTER — Ambulatory Visit (INDEPENDENT_AMBULATORY_CARE_PROVIDER_SITE_OTHER): Payer: 59 | Admitting: Obstetrics and Gynecology

## 2015-10-11 ENCOUNTER — Encounter: Payer: Self-pay | Admitting: Obstetrics and Gynecology

## 2015-10-11 VITALS — BP 110/68 | Ht 63.0 in | Wt 160.6 lb

## 2015-10-11 DIAGNOSIS — N926 Irregular menstruation, unspecified: Secondary | ICD-10-CM | POA: Diagnosis not present

## 2015-10-11 DIAGNOSIS — Z3491 Encounter for supervision of normal pregnancy, unspecified, first trimester: Secondary | ICD-10-CM | POA: Diagnosis not present

## 2015-10-11 DIAGNOSIS — O99281 Endocrine, nutritional and metabolic diseases complicating pregnancy, first trimester: Secondary | ICD-10-CM

## 2015-10-11 DIAGNOSIS — E039 Hypothyroidism, unspecified: Secondary | ICD-10-CM | POA: Diagnosis not present

## 2015-10-11 DIAGNOSIS — E559 Vitamin D deficiency, unspecified: Secondary | ICD-10-CM | POA: Diagnosis not present

## 2015-10-11 DIAGNOSIS — Z8632 Personal history of gestational diabetes: Secondary | ICD-10-CM | POA: Diagnosis not present

## 2015-10-11 DIAGNOSIS — E538 Deficiency of other specified B group vitamins: Secondary | ICD-10-CM | POA: Diagnosis not present

## 2015-10-11 LAB — POCT URINE PREGNANCY: Preg Test, Ur: POSITIVE — AB

## 2015-10-12 ENCOUNTER — Ambulatory Visit (INDEPENDENT_AMBULATORY_CARE_PROVIDER_SITE_OTHER): Payer: 59

## 2015-10-12 ENCOUNTER — Other Ambulatory Visit: Payer: 59

## 2015-10-12 DIAGNOSIS — N926 Irregular menstruation, unspecified: Secondary | ICD-10-CM | POA: Diagnosis not present

## 2015-10-12 LAB — PAIN MGT SCRN (14 DRUGS), UR
AMPHETAMINE SCRN UR: NEGATIVE ng/mL
BARBITURATE SCRN UR: NEGATIVE ng/mL
BUPRENORPHINE, URINE: NEGATIVE ng/mL
Benzodiazepine Screen, Urine: NEGATIVE ng/mL
CANNABINOIDS UR QL SCN: NEGATIVE ng/mL
COCAINE(METAB.) SCREEN, URINE: NEGATIVE ng/mL
Creatinine(Crt), U: 31.6 mg/dL (ref 20.0–300.0)
Fentanyl, Urine: NEGATIVE pg/mL
METHADONE SCREEN, URINE: NEGATIVE ng/mL
Meperidine Screen, Urine: NEGATIVE ng/mL
Opiate Scrn, Ur: NEGATIVE ng/mL
Oxycodone+Oxymorphone Ur Ql Scn: NEGATIVE ng/mL
PCP SCRN UR: NEGATIVE ng/mL
PH UR, DRUG SCRN: 5.9 (ref 4.5–8.9)
PROPOXYPHENE SCREEN: NEGATIVE ng/mL
TRAMADOL UR QL SCN: NEGATIVE ng/mL

## 2015-10-12 LAB — MICROSCOPIC EXAMINATION: CASTS: NONE SEEN /LPF

## 2015-10-12 LAB — URINALYSIS, ROUTINE W REFLEX MICROSCOPIC
BILIRUBIN UA: NEGATIVE
GLUCOSE, UA: NEGATIVE
KETONES UA: NEGATIVE
NITRITE UA: NEGATIVE
PROTEIN UA: NEGATIVE
RBC UA: NEGATIVE
Specific Gravity, UA: 1.008 (ref 1.005–1.030)
UUROB: 0.2 mg/dL (ref 0.2–1.0)
pH, UA: 6 (ref 5.0–7.5)

## 2015-10-12 LAB — NICOTINE SCREEN, URINE: COTININE UR QL SCN: NEGATIVE ng/mL

## 2015-10-12 LAB — GC/CHLAMYDIA PROBE AMP
CHLAMYDIA, DNA PROBE: NEGATIVE
NEISSERIA GONORRHOEAE BY PCR: NEGATIVE

## 2015-10-13 LAB — URINE CULTURE, OB REFLEX

## 2015-10-13 LAB — CULTURE, OB URINE

## 2015-10-16 ENCOUNTER — Telehealth: Payer: Self-pay

## 2015-10-16 LAB — CBC WITH DIFFERENTIAL/PLATELET
BASOS ABS: 0 10*3/uL (ref 0.0–0.2)
Basos: 1 %
EOS (ABSOLUTE): 0.1 10*3/uL (ref 0.0–0.4)
Eos: 2 %
HEMOGLOBIN: 10.9 g/dL — AB (ref 11.1–15.9)
Hematocrit: 34.8 % (ref 34.0–46.6)
Immature Grans (Abs): 0 10*3/uL (ref 0.0–0.1)
Immature Granulocytes: 0 %
LYMPHS ABS: 1.7 10*3/uL (ref 0.7–3.1)
Lymphs: 28 %
MCH: 24.3 pg — AB (ref 26.6–33.0)
MCHC: 31.3 g/dL — AB (ref 31.5–35.7)
MCV: 78 fL — ABNORMAL LOW (ref 79–97)
Monocytes Absolute: 0.4 10*3/uL (ref 0.1–0.9)
Monocytes: 7 %
NEUTROS ABS: 3.7 10*3/uL (ref 1.4–7.0)
Neutrophils: 62 %
PLATELETS: 310 10*3/uL (ref 150–379)
RBC: 4.48 x10E6/uL (ref 3.77–5.28)
RDW: 16 % — ABNORMAL HIGH (ref 12.3–15.4)
WBC: 6 10*3/uL (ref 3.4–10.8)

## 2015-10-16 LAB — HEPATITIS B SURFACE ANTIGEN: Hepatitis B Surface Ag: NEGATIVE

## 2015-10-16 LAB — ABO AND RH: RH TYPE: POSITIVE

## 2015-10-16 LAB — HEMOGLOBIN A1C
ESTIMATED AVERAGE GLUCOSE: 120 mg/dL
HEMOGLOBIN A1C: 5.8 % — AB (ref 4.8–5.6)

## 2015-10-16 LAB — RPR: RPR Ser Ql: NONREACTIVE

## 2015-10-16 LAB — VITAMIN B12: Vitamin B-12: 294 pg/mL (ref 211–946)

## 2015-10-16 LAB — GLUCOSE, RANDOM: GLUCOSE: 78 mg/dL (ref 65–99)

## 2015-10-16 LAB — HIV ANTIBODY (ROUTINE TESTING W REFLEX): HIV Screen 4th Generation wRfx: NONREACTIVE

## 2015-10-16 LAB — TSH: TSH: 3.32 u[IU]/mL (ref 0.450–4.500)

## 2015-10-16 LAB — VARICELLA ZOSTER ANTIBODY, IGG: VARICELLA: 455 {index} (ref >165)

## 2015-10-16 LAB — VITAMIN D 25 HYDROXY (VIT D DEFICIENCY, FRACTURES): VIT D 25 HYDROXY: 7.1 ng/mL — AB (ref 30.0–100.0)

## 2015-10-16 LAB — ANTIBODY SCREEN: ANTIBODY SCREEN: NEGATIVE

## 2015-10-16 LAB — RUBELLA SCREEN: Rubella Antibodies, IGG: 3.76 index (ref >0.99)

## 2015-10-16 NOTE — Telephone Encounter (Signed)
Pt was called earlier about ultrasound f/u which is scheduled on 10/26/15 and pt understands why. Also inquired about her lab work results, especially her Vitamin D (7.0 low); FBS, HbgA1c. Pt informed. Would like to know if she needs some vitamin D in addition to her pnv. She has a history of it being low in the past.  She will be seeing [facility] for her NOB Physical in 2wks when her f/u ultrasound is done.

## 2015-10-17 ENCOUNTER — Other Ambulatory Visit: Payer: Self-pay | Admitting: Obstetrics and Gynecology

## 2015-10-17 DIAGNOSIS — E559 Vitamin D deficiency, unspecified: Secondary | ICD-10-CM | POA: Insufficient documentation

## 2015-10-17 MED ORDER — VITAMIN D (ERGOCALCIFEROL) 1.25 MG (50000 UNIT) PO CAPS: 50000.0000 [IU] | ORAL_CAPSULE | ORAL | Status: AC

## 2015-10-17 NOTE — Telephone Encounter (Signed)
Absolutely will need additional Vit D as the PNV doesn't have much in it. I sent in prescription of the higher dose Vit D, and want her to take it 2 x week for remainder of pregnancy.  Also Hgb was slightly low, so I want her to increase iron rich food in her diet daily to help it.  As far as elevated HgbA1C- will will discuss at physical, but can let her know we will do an early glucola around 18 weeks to check it.

## 2015-10-17 NOTE — Telephone Encounter (Signed)
Left message to contact office

## 2015-10-18 NOTE — Telephone Encounter (Signed)
Pharmacy had notified pt of vitamin d rx, pt was informed of hgb slightly low and to increase iron food in her diet daily to help and HgbA1c to be done at approx 18 weeks. And will discuss at physical. NOB physical appt canceled, pt aware she needs to be 12 wks for her appt. Will determine when her ultrasound is done on 10/26/2015.

## 2015-10-19 ENCOUNTER — Other Ambulatory Visit: Payer: Self-pay | Admitting: Obstetrics and Gynecology

## 2015-10-19 DIAGNOSIS — O3680X0 Pregnancy with inconclusive fetal viability, not applicable or unspecified: Secondary | ICD-10-CM

## 2015-10-22 ENCOUNTER — Encounter: Payer: Self-pay | Admitting: Obstetrics and Gynecology

## 2015-10-22 NOTE — Patient Instructions (Signed)
1.  Prenatal labs obtained. 2.  Continue Synthroid 50 g a day. 3.  Ultrasound scheduled to confirm fetal viability, and EDD. 4.  Return for new OB nursing intake on 10/25/2015

## 2015-10-22 NOTE — Progress Notes (Signed)
GYN ENCOUNTER NOTE  Subjective:       Kristina Mueller is a 30 y.o. G38P1011 female is here for gynecologic evaluation of the following issues:  1. Pregnancy confirmation  30 year old female, married, gravida 3, para 1011, unsure last menstrual period, presents forfirmation of pregnancy.  Patient has had a positive home pregnancy test. She reports nausea without vomiting.  She does report breast tenderness and chills as well as increased thirst.  Past obstetric history: G1-37 weeks; SVD, 7 lbs. 8 oz. Female; regnancy complicated by A2 gestational diabetes mellitus and hypothyroidism. G2-SAB requiring D&C (Dr. Greggory Keen). G3-current.     Gynecologic History Patient's last menstrual period was 08/05/2015. Irregular Contraception: none   Obstetric History OB History  Gravida Para Term Preterm AB SAB TAB Ectopic Multiple Living  # Outcome Date GA Lbr Len/2nd Weight Sex Delivery Anes PTL Lv  3 Current           2 SAB 2014          1 Term 2012   7 lb 2.4 oz (3.243 kg) F Vag-Spont   Y      Past Medical History  Diagnosis Date  . Thyroid disease     hypothyroid  . Gestational diabetes     Past Surgical History  Procedure Laterality Date  . Dilation and curettage of uterus      No current outpatient prescriptions on file prior to visit.   No current facility-administered medications on file prior to visit.    No Known Allergies  Social History   Social History  . Marital Status: Married    Spouse Name: N/A  . Number of Children: N/A  . Years of Education: N/A   Occupational History  . Not on file.   Social History Main Topics  . Smoking status: Never Smoker   . Smokeless tobacco: Never Used  . Alcohol Use: No  . Drug Use: No  . Sexual Activity: Yes    Birth Control/ Protection: Condom, None   Other Topics Concern  . Not on file   Social History Narrative    Family History  Problem Relation Age of Onset  . Diabetes Mother   .  Hypertension Mother   . Hypertension Father   . Cancer Neg Hx     The following portions of the patient's history were reviewed and updated as appropriate: allergies, current medications, past family history, past medical history, past social history, past surgical history and problem list.  Review of Systems Review of Systems - General ROS: negative for - fatigue, fever, hot flashes, malaise or night sweatsPOSITIVE- chills, Hematological and Lymphatic ROS: negative for - bleeding problems or swollen lymph nodes Gastrointestinal ROS: negative for - abdominal pain, blood in stools, change in bowel habits and vomiting. POSITIVE- nausea/ Musculoskeletal ROS: negative for - joint pain, muscle pain or muscular weakness Genito-Urinary ROS: negative for - change in menstrual cycle, dysmenorrhea, dyspareunia, dysuria, genital discharge, genital ulcers, hematuria, incontinence, irregular/heavy menses, nocturia or pelvic pain  Objective:   BP 110/68 mmHg  Ht  (1.6 m)  Wt 160 lb 9.6 oz (72.848 kg)  BMI 28.46 kg/m2  LMP 08/05/2015 Physical exam-deferred  Assessment:   1. Irregular menses - POCT urine pregnancy - US OB Transvaginal; Future - US OB Comp Less 14 Wks; Future  2. Supervision of normal pregnancy, first trimester - ABO AND RH  - Antibody screen - CBC with Differential/Platelet -  Culture, OB Urine - GC/Chlamydia Probe Amp - Hepatitis B surface antigen - HIV antibody - Nicotine screen, urine - Pain Mgt Scrn (14 Drugs), Ur - RPR - Rubella screen - Urinalysis, Routine w reflex microscopic (not at [facility]) - Varicella zoster antibody, IgG  3. Vitamin D deficiency - VITAMIN D 25 Hydroxy (Vit-D Deficiency, Fractures)  4. Vitamin B12 deficiency - Vitamin B12  5. History of gestational diabetes mellitus (GDM) - Hemoglobin A1c - Glucose, random  6. Hypothyroid in pregnancy, antepartum, first trimester - TSH -Continue Synthroid 50 g per day     Plan:   1. New OB  counseling:  The patient has been given an overview regarding routine prenatal care.  Recommendations regarding diet, weight gain, and exercise in pregnancy were given.  Prenatal testing, optional genetic testing, and ultrasound use in pregnancy were reviewed.   Benefits of Breast Feeding were discussed. The patient is encouraged to consider nursing her baby post partum.  Herold Harms, MD  Note: This dictation was prepared with Dragon dictation along with smaller phrase technology. Any transcriptional errors that result from this process are unintentional.

## 2015-10-24 ENCOUNTER — Telehealth: Payer: Self-pay | Admitting: Obstetrics and Gynecology

## 2015-10-24 ENCOUNTER — Other Ambulatory Visit: Payer: Self-pay | Admitting: *Deleted

## 2015-10-24 MED ORDER — ONDANSETRON 8 MG PO TBDP: 8.0000 mg | ORAL_TABLET | Freq: Three times a day (TID) | ORAL | Status: AC | PRN

## 2015-10-24 NOTE — Telephone Encounter (Signed)
Discussed lab results and sent in zofran for nausea

## 2015-10-24 NOTE — Telephone Encounter (Signed)
pts husband would like a call back about the hemoglobin A1C they are getting conflicting numbers and just want to make sure.

## 2015-10-26 ENCOUNTER — Encounter: Payer: Self-pay | Admitting: Obstetrics and Gynecology

## 2015-10-26 ENCOUNTER — Other Ambulatory Visit: Payer: 59

## 2015-11-02 ENCOUNTER — Ambulatory Visit (INDEPENDENT_AMBULATORY_CARE_PROVIDER_SITE_OTHER): Payer: 59

## 2015-11-02 DIAGNOSIS — O3680X0 Pregnancy with inconclusive fetal viability, not applicable or unspecified: Secondary | ICD-10-CM

## 2015-11-14 ENCOUNTER — Telehealth: Payer: Self-pay | Admitting: Obstetrics and Gynecology

## 2015-11-14 NOTE — Telephone Encounter (Signed)
i couldn't understand pt very well, but she said she has a craving for ice and wants to know about some testing

## 2015-11-14 NOTE — Telephone Encounter (Signed)
Sorry changed of number

## 2015-11-16 NOTE — Telephone Encounter (Signed)
Husband came in and we discussed the ice issue

## 2015-11-21 ENCOUNTER — Telehealth: Payer: Self-pay | Admitting: Obstetrics and Gynecology

## 2015-11-21 NOTE — Telephone Encounter (Signed)
pls advise

## 2015-11-21 NOTE — Telephone Encounter (Signed)
Pt wants a glucose tolerancy test right away like tomorrow. She said she has the symtoms, feeling low, itching, sugar has been low according to glucometer. Please call husband with answer

## 2015-11-22 ENCOUNTER — Other Ambulatory Visit: Payer: Self-pay | Admitting: Obstetrics and Gynecology

## 2015-11-22 DIAGNOSIS — Z3490 Encounter for supervision of normal pregnancy, unspecified, unspecified trimester: Secondary | ICD-10-CM

## 2015-11-22 MED ORDER — FUSION PLUS PO CAPS: 1.0000 | ORAL_CAPSULE | Freq: Every day | ORAL | Status: AC

## 2015-11-22 NOTE — Telephone Encounter (Signed)
Please let her know I placed the order to have it done at her NOB physical, and current symptoms are more likely due to low Vit D and occasional low sugars- which are common in the first and second trimester.  She is also anemic and that can add to symptoms- I sent a prescription for iron supplement to add daily to PNV. Also make sure she is taking vit D supplement two times a week. Eating frequent small snacks high in protein every two hours and hydrating well.

## 2015-11-22 NOTE — Telephone Encounter (Signed)
Spoke with husband, he is hard to read, wants things done his way and doesn't want to take the advice from the midwife, tried to explain to him the best way i knew how

## 2015-11-30 ENCOUNTER — Encounter: Payer: 59 | Admitting: Obstetrics and Gynecology

## 2015-12-04 ENCOUNTER — Encounter: Payer: 59 | Admitting: Obstetrics and Gynecology
